# Patient Record
Sex: Male | Born: 2000 | Race: White | Hispanic: No | Marital: Single | Smoking: Never smoker
Health system: Southern US, Community
[De-identification: ages and names within clinical notes are randomized; demographics above are authoritative.]

---
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
## 2020-10-15 IMAGING — CR DG SHOULDER 2+V*R*
3 series · 3 of 3 positions shown · non-contrast
Comparison: None.

CLINICAL DATA: Recent dislocation of the shoulder with spontaneous
relocation, evaluate placement

EXAM:
RIGHT SHOULDER - 2+ VIEW

[w shoulder y-view right]
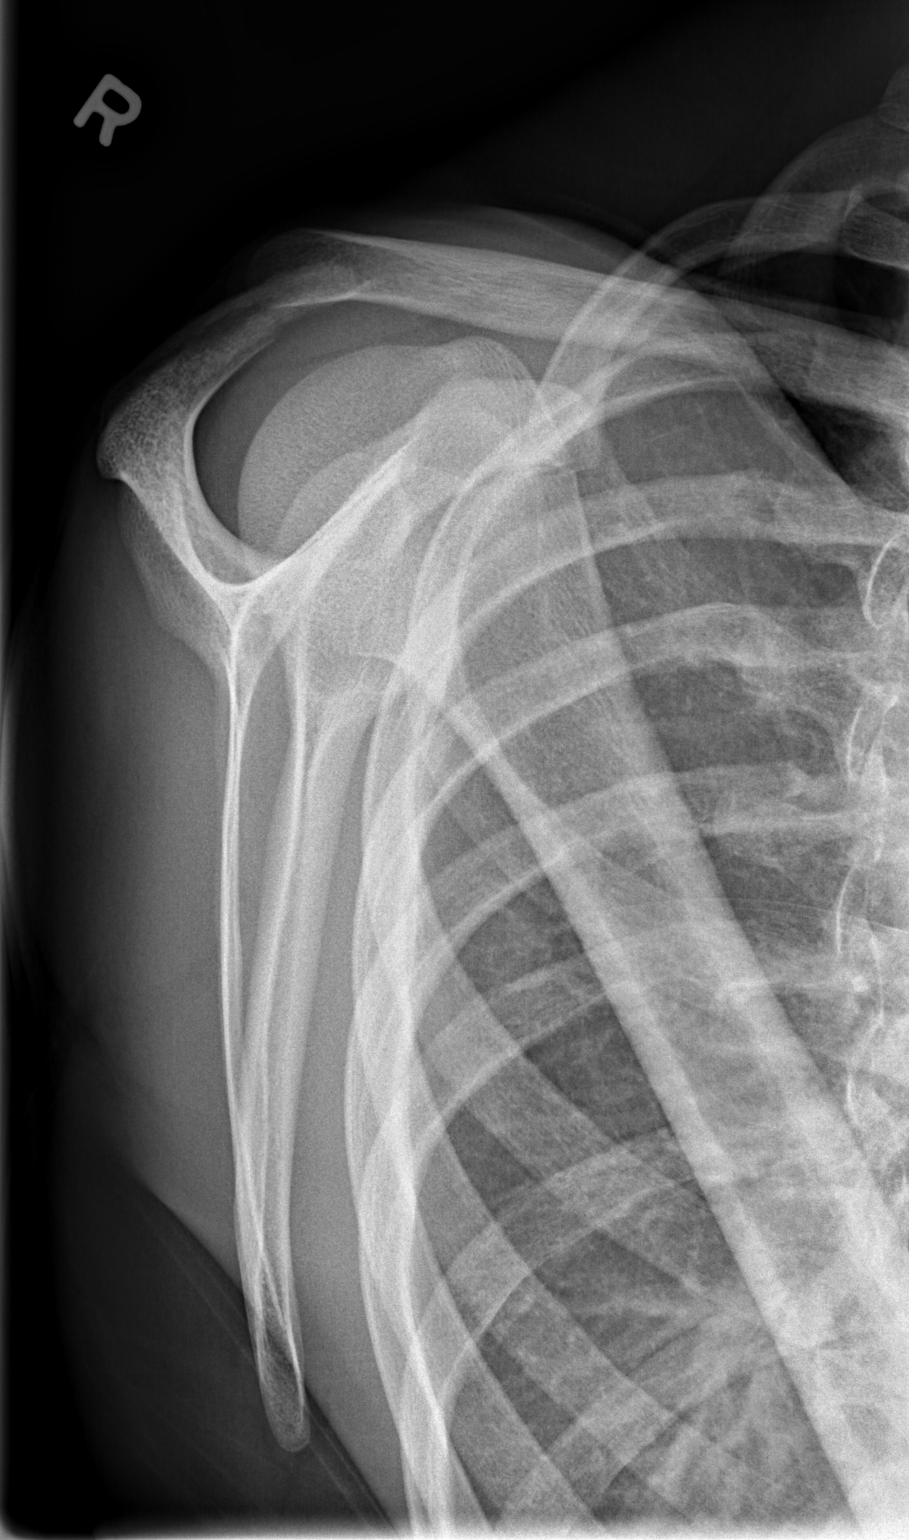

[w shoulder external right]
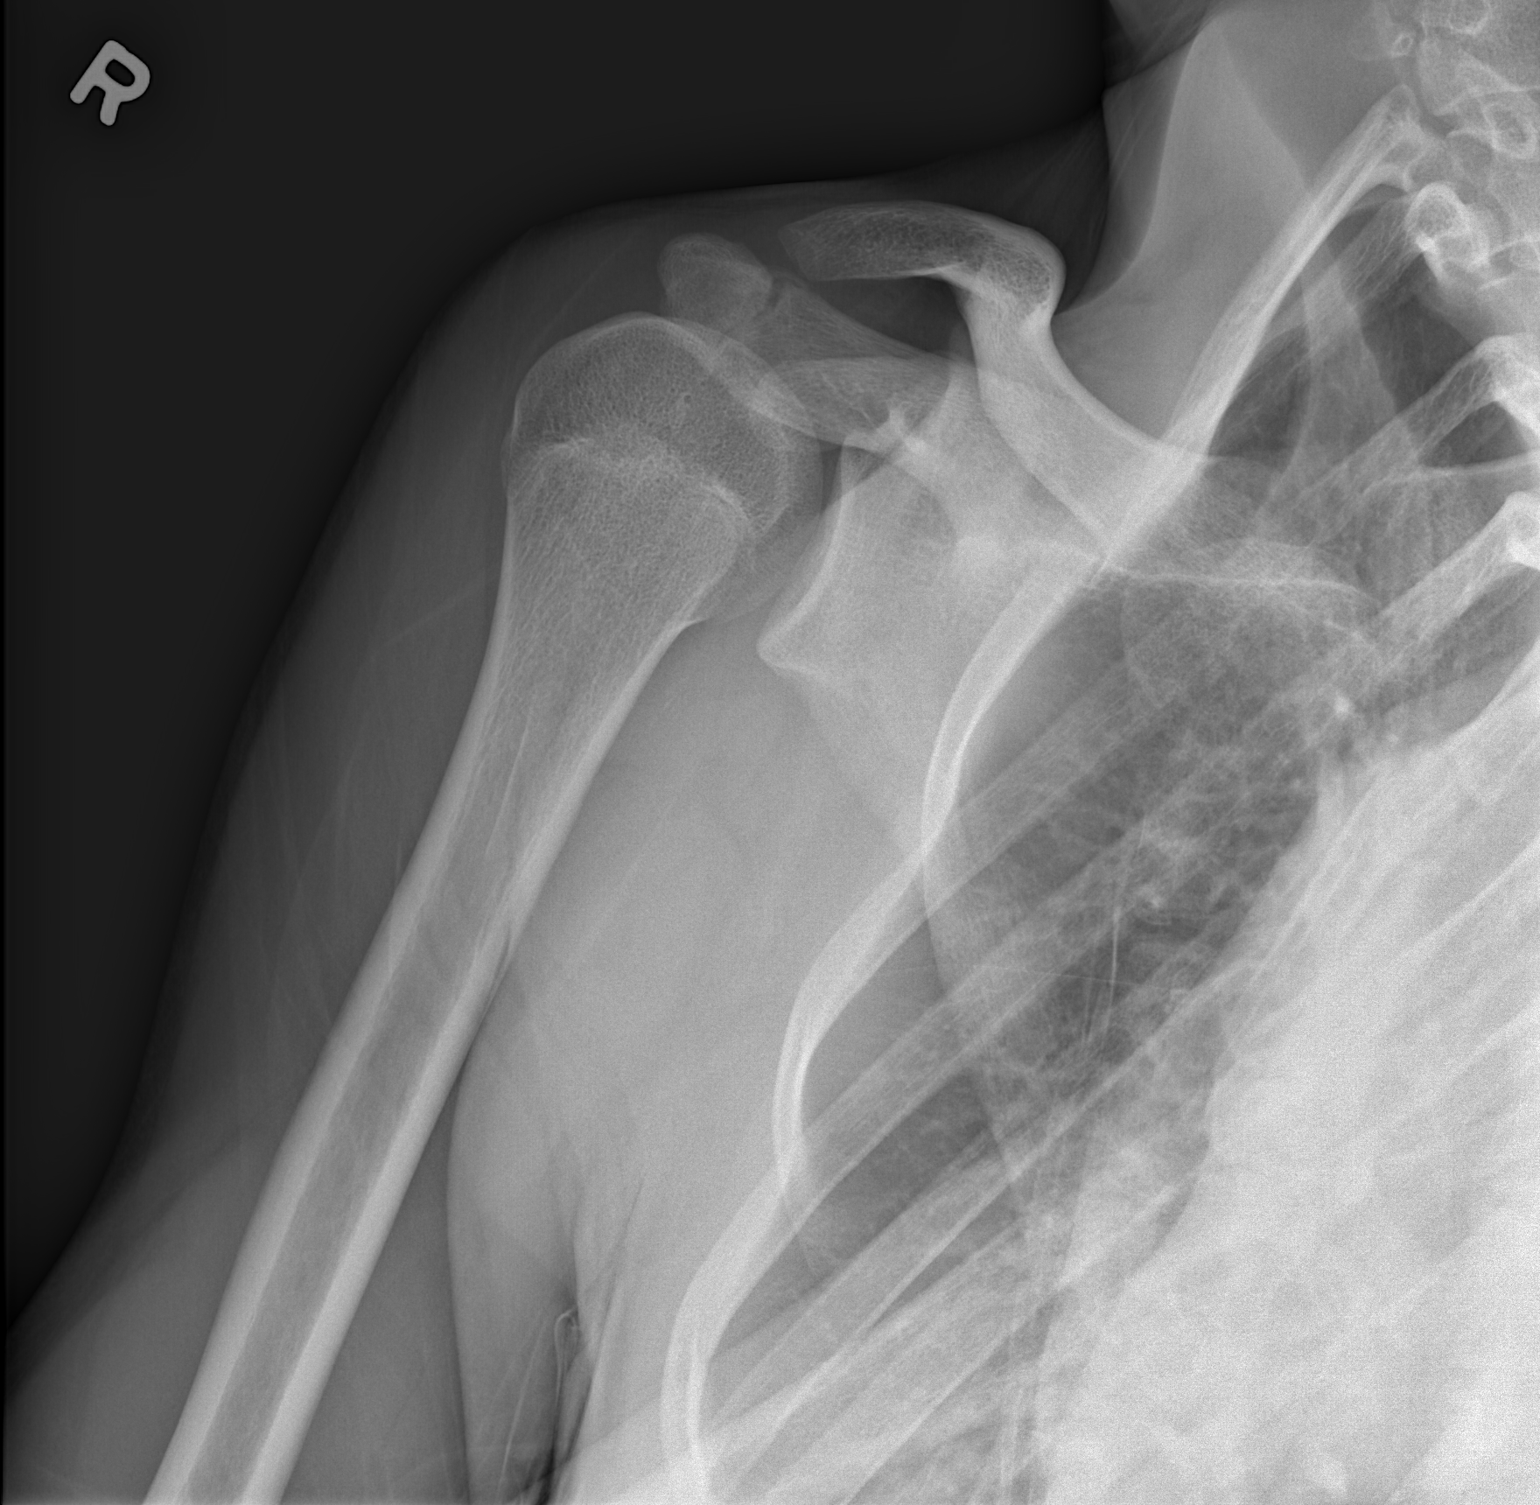

[x shoulder axillary right]
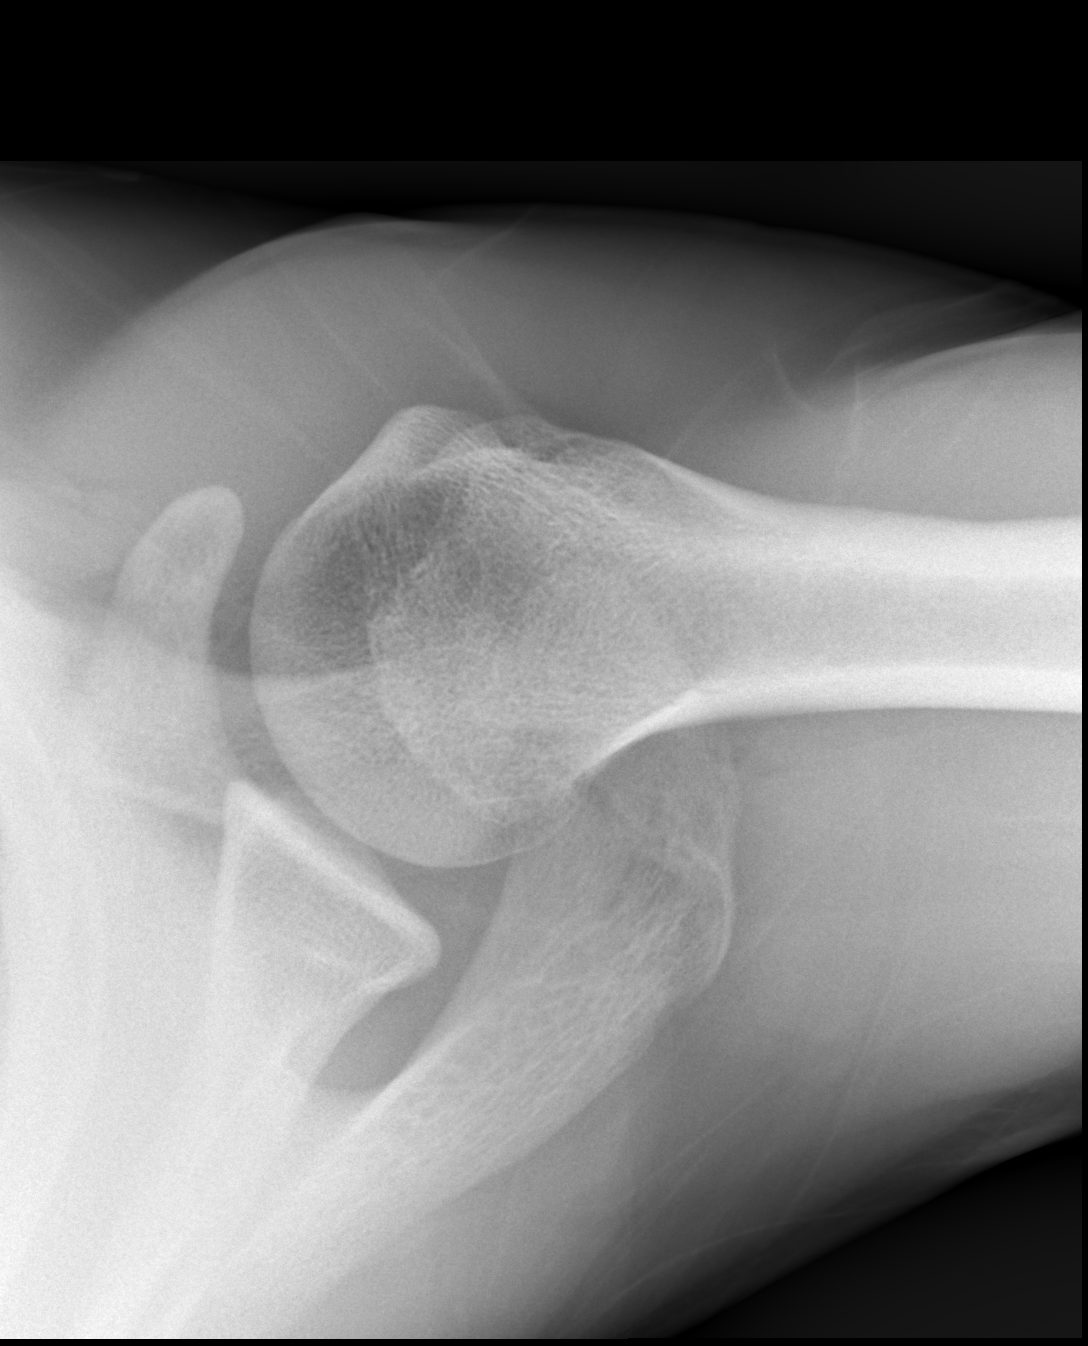

[3 of 3 positions shown; findings below may reference images not displayed]

FINDINGS: Humeral head is well seated in the glenoid. Os acromiale is noted.
No acute fracture or dislocation is seen. Underlying bony thorax
appears within normal limits.
IMPRESSION: Normal positioning of the humeral head.  No acute abnormality noted.
